# Patient Record
Sex: Female | Born: 1980 | Race: White | Hispanic: No | Marital: Single | Smoking: Never smoker
Health system: Southern US, Community
[De-identification: ages and names within clinical notes are randomized; demographics above are authoritative.]

## PROBLEM LIST (undated history)

## (undated) DIAGNOSIS — F32A Depression, unspecified: Secondary | ICD-10-CM

## (undated) DIAGNOSIS — N809 Endometriosis, unspecified: Secondary | ICD-10-CM

## (undated) DIAGNOSIS — F419 Anxiety disorder, unspecified: Secondary | ICD-10-CM

## (undated) HISTORY — DX: Anxiety disorder, unspecified: F41.9

## (undated) HISTORY — PX: LAPAROSCOPY: SHX197

## (undated) HISTORY — DX: Depression, unspecified: F32.A

---

## 2007-03-25 ENCOUNTER — Other Ambulatory Visit: Admission: RE | Admit: 2007-03-25 | Discharge: 2007-03-25 | Payer: Self-pay | Admitting: Family Medicine

## 2007-09-12 ENCOUNTER — Ambulatory Visit: Admission: RE | Admit: 2007-09-12 | Discharge: 2007-09-12 | Payer: Self-pay | Admitting: Family Medicine

## 2007-10-31 ENCOUNTER — Ambulatory Visit (HOSPITAL_COMMUNITY): Admission: RE | Admit: 2007-10-31 | Discharge: 2007-10-31 | Payer: Self-pay | Admitting: Pulmonary Disease

## 2007-12-05 ENCOUNTER — Ambulatory Visit (HOSPITAL_COMMUNITY): Admission: RE | Admit: 2007-12-05 | Discharge: 2007-12-05 | Payer: Self-pay | Admitting: Internal Medicine

## 2007-12-08 ENCOUNTER — Encounter (INDEPENDENT_AMBULATORY_CARE_PROVIDER_SITE_OTHER): Payer: Self-pay | Admitting: Obstetrics and Gynecology

## 2007-12-08 ENCOUNTER — Ambulatory Visit (HOSPITAL_COMMUNITY): Admission: RE | Admit: 2007-12-08 | Discharge: 2007-12-08 | Payer: Self-pay | Admitting: Obstetrics and Gynecology

## 2007-12-09 ENCOUNTER — Encounter: Admission: RE | Admit: 2007-12-09 | Discharge: 2007-12-09 | Payer: Self-pay | Admitting: Obstetrics and Gynecology

## 2009-03-18 IMAGING — CT CT ABDOMEN W/ CM
2 of 5 series · 17 of 46 positions shown, 19 images · IV contrast (READICAT/WATER & [ID] OMNI 300)
Comparison: Ultrasound from 10/31/2007

CLINICAL DATA: Splenomegalyfound on laparoscopy yesterday

CT ABDOMEN WITH CONTRAST
TECHNIQUE: Multidetector CT imaging of the abdomen was performed
following the standard protocol during bolus administration of
intravenous contrast.
Contrast: 100 ml Omnipaque 300

[Series 3: routine abdomen · axial · 0.70mm/px · z∈[-229,+6]mm · 14 of 55 slices shown, 16 images]
[im 4/55  soft-tissue]
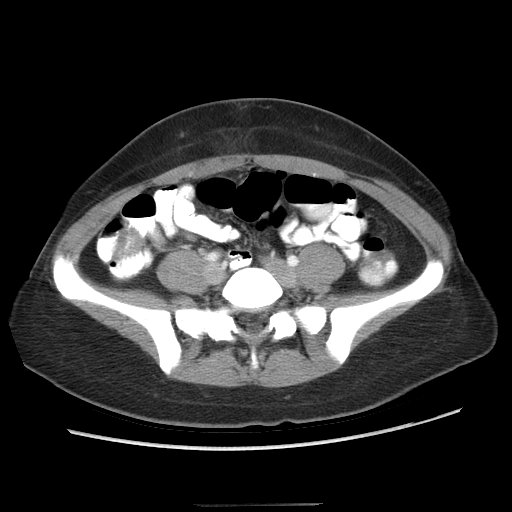
[im 4/55  bone]
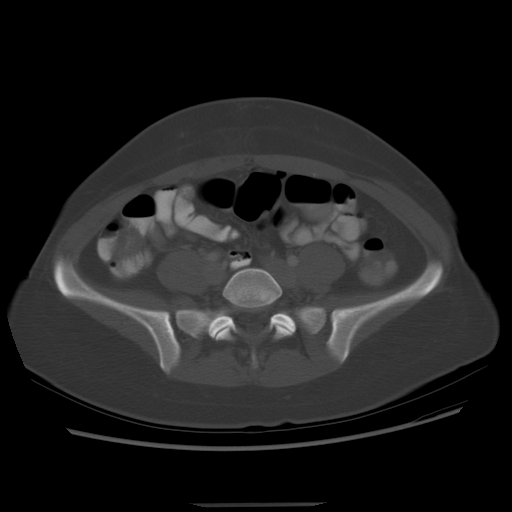
[im 8/55  soft-tissue]
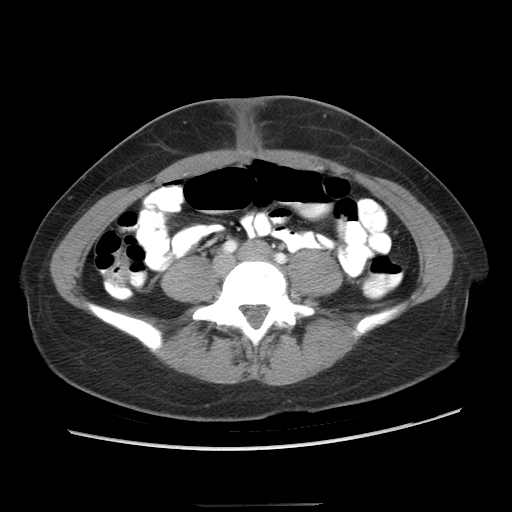
[im 11/55  soft-tissue]
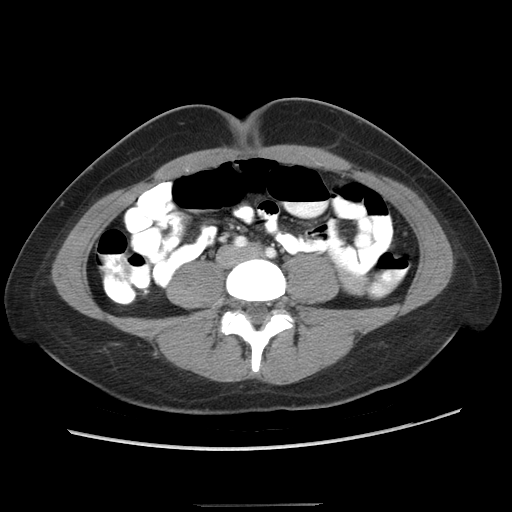
[im 15/55  soft-tissue]
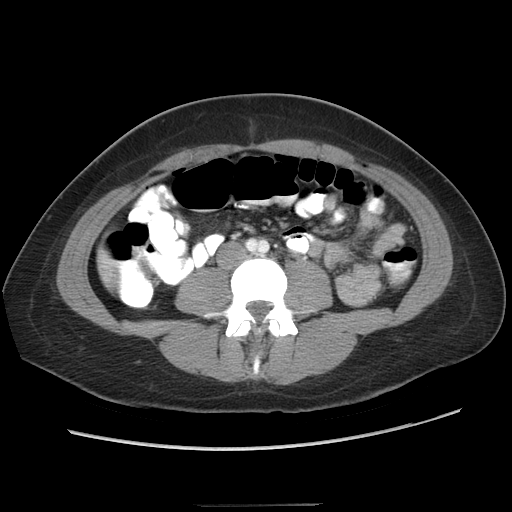
[im 19/55  soft-tissue]
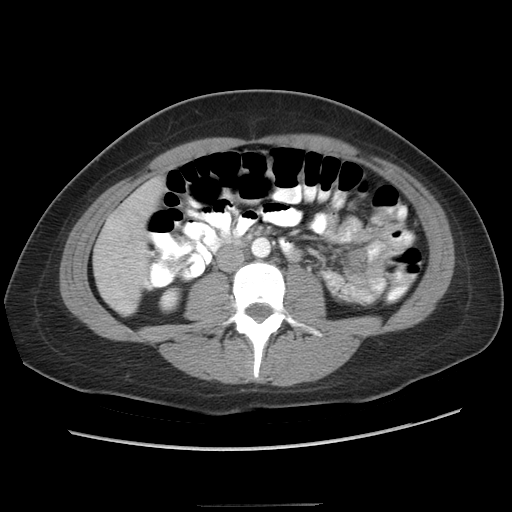
[im 22/55  soft-tissue]
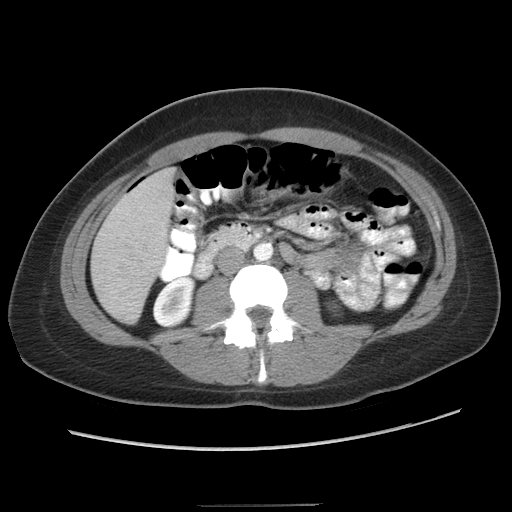
[im 26/55  soft-tissue]
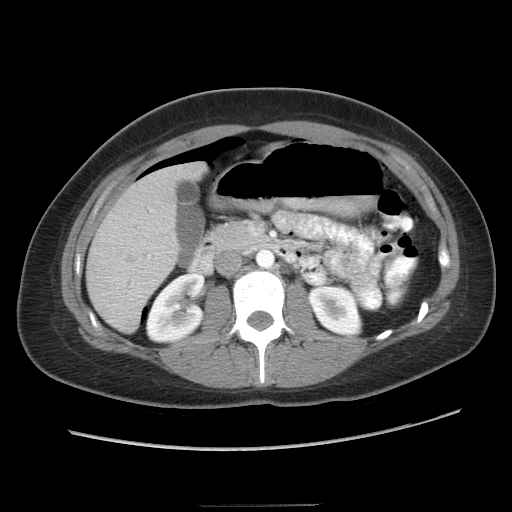
[im 29/55  soft-tissue]
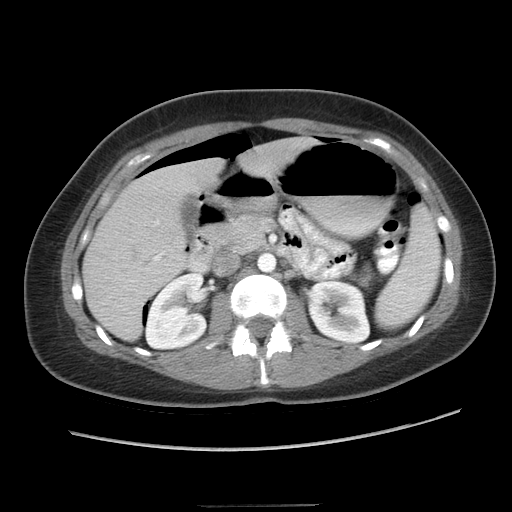
[im 33/55  soft-tissue]
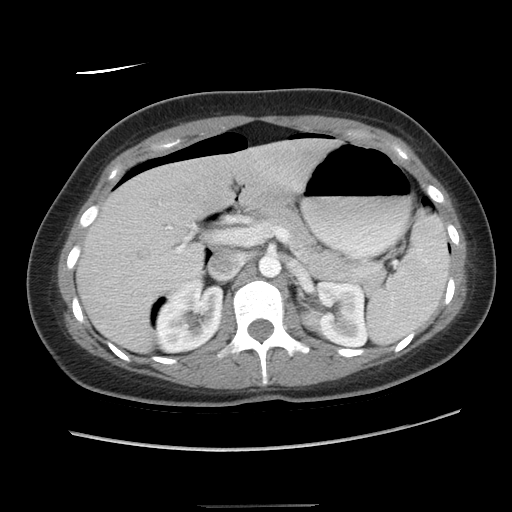
[im 33/55  bone]
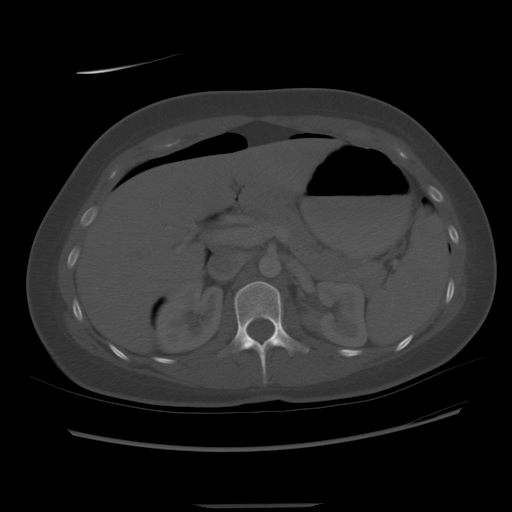
[im 37/55  soft-tissue]
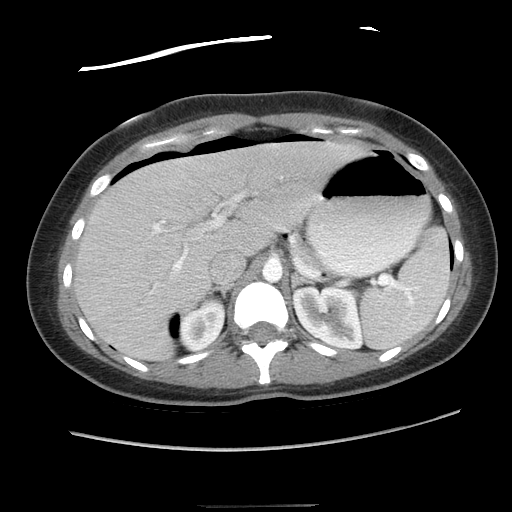
[im 40/55  soft-tissue]
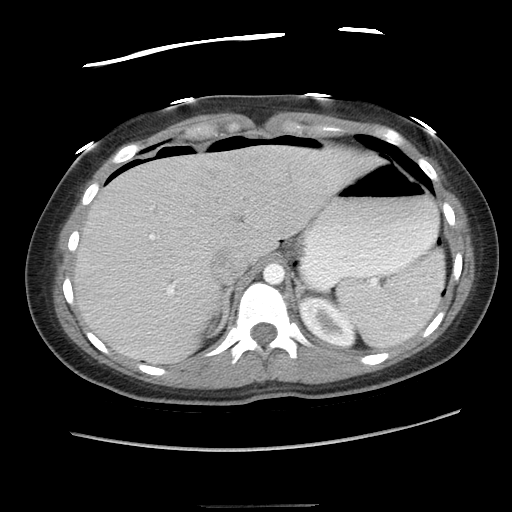
[im 44/55  soft-tissue]
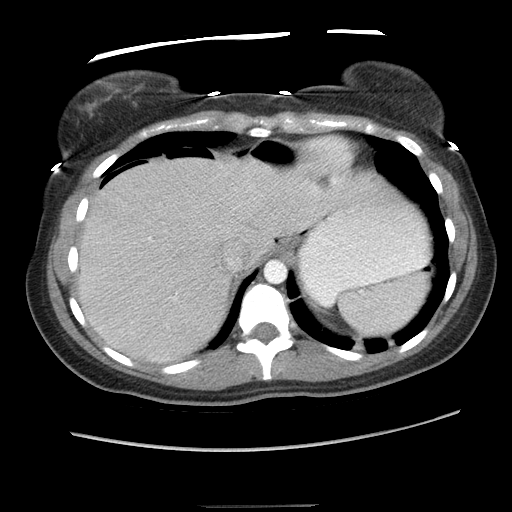
[im 47/55  soft-tissue]
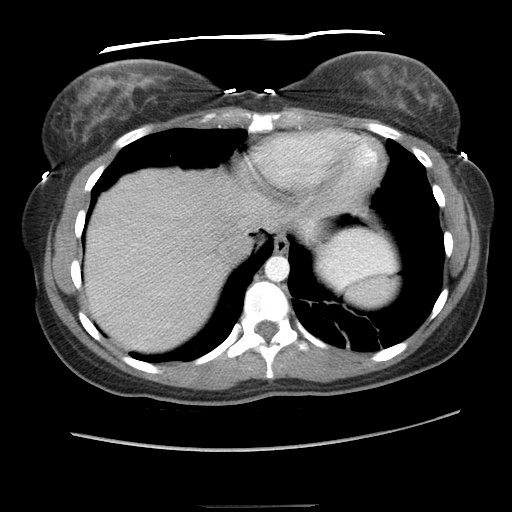
[im 51/55  soft-tissue]
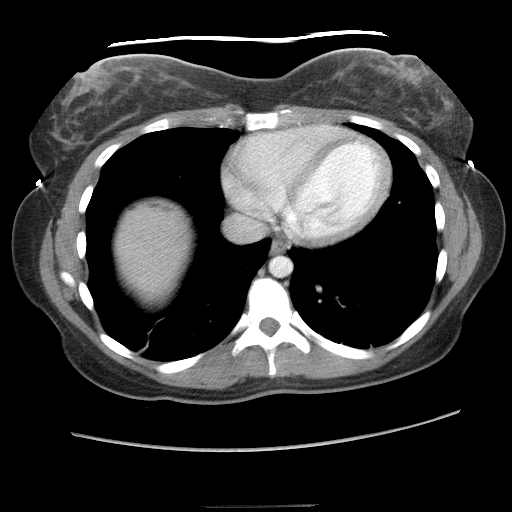

[Series 602: sagittal body · sagittal · 0.70mm/px · 3 of 144 slices shown]
[im 48/144  soft-tissue]
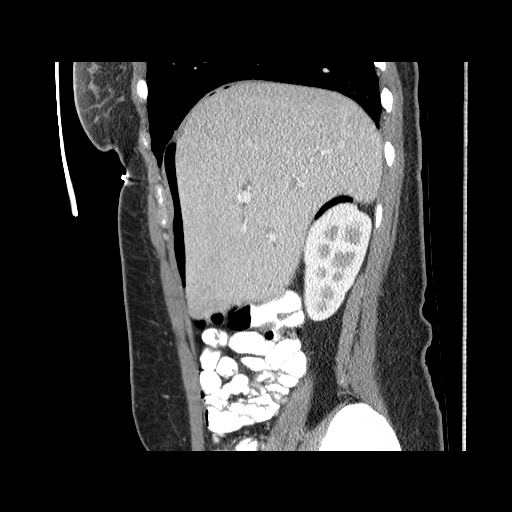
[im 64/144  soft-tissue]
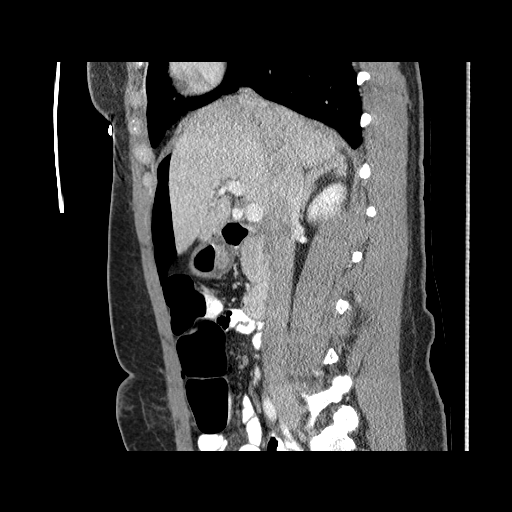
[im 80/144  soft-tissue]
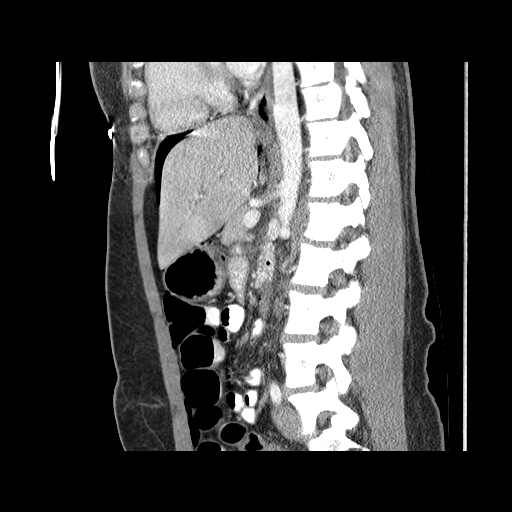

[17 of 46 positions shown; findings below may reference images not displayed]

FINDINGS: Mild periportal edema is seen in the liver.  The spleen
is normal in size.  Cranial caudal length of the spleen is 11 cm
and the coronal diameter is 7.5 cm.  The stomach, duodenum,
pancreas, gallbladder, adrenal glands, and kidneys have normal
imaging features.  There is no free intraperitoneal fluid.

Prominent intra peritoneal gas is compatible with the history of
surgery yesterday.  Edema and gas within the subcutaneous fat at
the level of the umbilicus is compatible with recent trocar
placement.
IMPRESSION: Spleen is normal in size and attenuation.

Sequelae of recent laparoscopy .  The technologist did confirm with
the the patient that her surgery was indeed yesterday.

## 2010-10-01 ENCOUNTER — Encounter: Payer: Self-pay | Admitting: Internal Medicine

## 2011-01-23 NOTE — Op Note (Signed)
Lauren Pena, Lauren Pena                ACCOUNT NO.:  [id number]   MEDICAL RECORD NO.:  [id number]          PATIENT TYPE:  AMB   LOCATION:  SDC                           FACILITY:  [facility]   PHYSICIAN:  Osborn Coho, M.D.   DATE OF BIRTH:  [birth year]   DATE OF PROCEDURE:  12/08/2007  DATE OF DISCHARGE:                               OPERATIVE REPORT   PREOPERATIVE DIAGNOSIS:  Pelvic pain.   POSTOPERATIVE DIAGNOSIS:  Pelvic pain.   PROCEDURE:  1. Diagnostic laparoscopy.  2. Chromopertubation.  3. Peritoneal biopsies.   ATTENDING:  Osborn Coho, M.D.   ASSISTANT:  Jaymes Graff, MD   ANESTHESIA:  General.   SPECIMENS TO PATHOLOGY:  Peritoneal biopsies x5.   FINDINGS:  Bilateral patent fallopian tubes with normal-appearing  bilateral ovaries and fallopian tubes, multiple adhesions of the bowel  to the bilateral side walls.   FLUIDS:  500 mL.   URINE OUTPUT:  200 mL.   ESTIMATED BLOOD LOSS:  Minimal.   COMPLICATIONS:  None.   PROCEDURE:  The patient was taken to the operating room after risks,  benefits, alternatives reviewed with the patient.  The patient  verbalized understanding and consent signed and witnessed.  The patient  was placed under general anesthesia and prepped and draped in the normal  sterile fashion in the dorsal lithotomy position.  A bivalve speculum  was placed in the patient's vagina and the anterior lip of the cervix  grasped with single-tooth tenaculum.  The acorn was placed in the cervix  for intrauterine manipulation and chromopertubation.  The tenaculum was  clamped to the acorn without difficulty.  The Foley was placed in the  bladder to gravity.  Attention was then turned to the abdomen after  gowning and regloving and a 10 mm incision was made at the umbilicus and  Veress needle introduced into the intra-abdominal cavity without  difficulty.  Pneumoperitoneum was achieved.  Multiple adhesions of the  bowel were noted to the bilateral sidewalls.   The spleen appeared  enlarged.  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  After injecting 0.5% Marcaine in the suprapubic  region and the left lower quadrant, 5-mm incisions were made and 5 mm  trocars were advanced under direct visualization.  Biopsies were  performed of the four lesions of the posterior cul-de-sac.  Two were  directly in the midline of the posterior cul-de-sac and two were on the  right lateral aspect of the posterior cul-de-sac.  The endometriotic  implants in the anterior abdominal wall was biopsied as well and was  noted on the right lateral aspect of the anterior abdominal wall near  the anterior cul-de-sac.  Good hemostasis of this area was noted.  There  was lysis of an adhesion from the left ovary to the left side wall.  The  ureters were noted to peristalsis without difficulty.  Irrigation and  suction of the irrigation was performed.  Pneumoperitoneum was relieved.  The patient had been placed in Trendelenburg  for the procedure and was  now removed from Trendelenburg to lay flat.  The fascia was repaired  with 0 Vicryl via a running stitch at the umbilicus and the skin  repaired at the umbilicus with 3-0 Monocryl via subcuticular stitch.  The two 5 mm ports, one in the suprapubic region and one left lower  quadrant were repaired with Dermabond.  Prior to performing the  biopsies, chromopertubation was performed with dilute indigo carmine and  the bilateral fallopian tubes were noted to be patent with free spillage  bilaterally.   Sponge lap and needle count was correct.  The tenaculum and acorn were  removed.  The Foley was removed as well.  The patient tolerated  procedure well and was returned to the recovery room in good condition.      Osborn Coho, M.D.  Electronically Signed     AR/MEDQ  D:  12/08/2007  T:  12/09/2007  Job:   [id number]

## 2011-04-25 ENCOUNTER — Other Ambulatory Visit: Payer: Self-pay

## 2011-06-04 LAB — HCG, SERUM, QUALITATIVE: Preg, Serum: NEGATIVE

## 2014-04-17 ENCOUNTER — Encounter (HOSPITAL_COMMUNITY): Payer: Self-pay | Admitting: Emergency Medicine

## 2014-04-17 DIAGNOSIS — Z8742 Personal history of other diseases of the female genital tract: Secondary | ICD-10-CM | POA: Diagnosis not present

## 2014-04-17 DIAGNOSIS — Z79899 Other long term (current) drug therapy: Secondary | ICD-10-CM | POA: Insufficient documentation

## 2014-04-17 DIAGNOSIS — K2970 Gastritis, unspecified, without bleeding: Secondary | ICD-10-CM | POA: Insufficient documentation

## 2014-04-17 DIAGNOSIS — R1013 Epigastric pain: Secondary | ICD-10-CM | POA: Insufficient documentation

## 2014-04-17 DIAGNOSIS — Z3202 Encounter for pregnancy test, result negative: Secondary | ICD-10-CM | POA: Insufficient documentation

## 2014-04-17 DIAGNOSIS — K2990 Gastroduodenitis, unspecified, without bleeding: Principal | ICD-10-CM

## 2014-04-17 LAB — CBC WITH DIFFERENTIAL/PLATELET
BASOS PCT: 0 % (ref 0–1)
Basophils Absolute: 0 10*3/uL (ref 0.0–0.1)
EOS ABS: 0.2 10*3/uL (ref 0.0–0.7)
Eosinophils Relative: 2 % (ref 0–5)
HCT: 42.4 % (ref 36.0–46.0)
HEMOGLOBIN: 14.7 g/dL (ref 12.0–15.0)
Lymphocytes Relative: 36 % (ref 12–46)
Lymphs Abs: 3.6 10*3/uL (ref 0.7–4.0)
MCH: 30 pg (ref 26.0–34.0)
MCHC: 34.7 g/dL (ref 30.0–36.0)
MCV: 86.5 fL (ref 78.0–100.0)
MONOS PCT: 6 % (ref 3–12)
Monocytes Absolute: 0.6 10*3/uL (ref 0.1–1.0)
NEUTROS PCT: 56 % (ref 43–77)
Neutro Abs: 5.7 10*3/uL (ref 1.7–7.7)
Platelets: 259 10*3/uL (ref 150–400)
RBC: 4.9 MIL/uL (ref 3.87–5.11)
RDW: 12.5 % (ref 11.5–15.5)
WBC: 10 10*3/uL (ref 4.0–10.5)

## 2014-04-17 LAB — URINALYSIS, ROUTINE W REFLEX MICROSCOPIC
Bilirubin Urine: NEGATIVE
Glucose, UA: NEGATIVE mg/dL
Hgb urine dipstick: NEGATIVE
Ketones, ur: NEGATIVE mg/dL
LEUKOCYTES UA: NEGATIVE
NITRITE: NEGATIVE
PROTEIN: NEGATIVE mg/dL
Specific Gravity, Urine: 1.015 (ref 1.005–1.030)
UROBILINOGEN UA: 0.2 mg/dL (ref 0.0–1.0)
pH: 6 (ref 5.0–8.0)

## 2014-04-17 LAB — PREGNANCY, URINE: PREG TEST UR: NEGATIVE

## 2014-04-17 NOTE — ED Notes (Signed)
Pt. reports upper abdominal pain /mid back pain onset this afternoon , denies nausea or vomitting , no fever or chills,  Denies dysuria or vaginal discharge .

## 2014-04-18 ENCOUNTER — Emergency Department (HOSPITAL_COMMUNITY)
Admission: EM | Admit: 2014-04-18 | Discharge: 2014-04-18 | Disposition: A | Discharge status: Home / Self Care | Payer: 59 | Attending: Emergency Medicine | Admitting: Emergency Medicine

## 2014-04-18 DIAGNOSIS — K2970 Gastritis, unspecified, without bleeding: Secondary | ICD-10-CM

## 2014-04-18 HISTORY — DX: Endometriosis, unspecified: N80.9

## 2014-04-18 LAB — COMPREHENSIVE METABOLIC PANEL
ALBUMIN: 3.9 g/dL (ref 3.5–5.2)
ALK PHOS: 80 U/L (ref 39–117)
ALT: 14 U/L (ref 0–35)
ANION GAP: 16 — AB (ref 5–15)
AST: 17 U/L (ref 0–37)
BUN: 17 mg/dL (ref 6–23)
CO2: 25 mEq/L (ref 19–32)
CREATININE: 0.73 mg/dL (ref 0.50–1.10)
Calcium: 9.8 mg/dL (ref 8.4–10.5)
Chloride: 100 mEq/L (ref 96–112)
GFR calc Af Amer: >90 mL/min (ref >90)
GFR calc non Af Amer: >90 mL/min (ref >90)
Glucose, Bld: 102 mg/dL — ABNORMAL HIGH (ref 70–99)
POTASSIUM: 4 meq/L (ref 3.7–5.3)
Sodium: 141 mEq/L (ref 137–147)
TOTAL PROTEIN: 7.4 g/dL (ref 6.0–8.3)
Total Bilirubin: <0.2 mg/dL — ABNORMAL LOW (ref 0.3–1.2)

## 2014-04-18 LAB — LIPASE, BLOOD: LIPASE: 37 U/L (ref 11–59)

## 2014-04-18 MED ORDER — PANTOPRAZOLE SODIUM 20 MG PO TBEC: 40.0000 mg | DELAYED_RELEASE_TABLET | Freq: Every day | ORAL | Status: AC

## 2014-04-18 MED ORDER — PANTOPRAZOLE SODIUM 40 MG PO TBEC
40.0000 mg | DELAYED_RELEASE_TABLET | Freq: Once | ORAL | Status: AC
  Administered 2014-04-18: 40 mg via ORAL
  Filled 2014-04-18: qty 1

## 2014-04-18 MED ORDER — ATENOLOL 25 MG PO TABS: 25.0000 mg | ORAL_TABLET | Freq: Every day | ORAL | Status: AC

## 2014-04-18 MED ORDER — ONDANSETRON 4 MG PO TBDP
4.0000 mg | ORAL_TABLET | Freq: Once | ORAL | Status: AC
  Administered 2014-04-18: 4 mg via ORAL
  Filled 2014-04-18: qty 1

## 2014-04-18 NOTE — ED Provider Notes (Signed)
Medical screening examination/treatment/procedure(s) were conducted as a shared visit with non-physician practitioner(s) and myself.  I personally evaluated the patient during the encounter  Please see my separate respective documentation pertaining to this patient encounter   Vida RollerBrian D Estephania Licciardi, MD 04/18/14 [id number]

## 2014-04-18 NOTE — ED Provider Notes (Signed)
33 year old female, no significant past medical history who presents with a complaint of epigastric discomfort which has some radiation around the sides to the back. She has not been vomiting. This seemed to get worse this evening with laying down and went away with antacid medications. On exam the patient has nontender abdomen, clear heart and lung sounds, normal EKG and normal lab work. Doubt pancreatitis, doubt cholecystitis, likely due to gastritis given the patient's heavy use of anti-inflammatory medications such as Naprosyn for chronic headaches. She is amenable to stopping his medications, I will start alternative headache medications and have followup with family doctor. We'll also start nightly antihistamine such as Pepcid or Zantac.  Medical screening examination/treatment/procedure(s) were conducted as a shared visit with non-physician practitioner(s) and myself.  I personally evaluated the patient during the encounter.  Clinical Impression: Epigastric pain     Vida RollerBrian D Rechelle Niebla, MD 04/18/14 [id number]

## 2014-04-18 NOTE — Discharge Instructions (Signed)
Return to the emergency room with worsening of symptoms or with symptoms that are concerning. Avoid NSAID use (aleve, ibuprofen, motrin, naproxen) Protonix two tablets once daily for 4 weeks. Follow up [facility]CP, [facility]. Take atenolol once daily for HA prevention. Follow up with PCP  Gastritis, Adult Gastritis is soreness and puffiness (inflammation) of the lining of the stomach. If you do not get help, gastritis can cause bleeding and sores (ulcers) in the stomach. HOME CARE   Only take medicine as told by your doctor.  If you were given antibiotic medicines, take them as told. Finish the medicines even if you start to feel better.  Drink enough fluids to keep your pee (urine) clear or pale yellow.  Avoid foods and drinks that make your problems worse. Foods you may want to avoid include:  Caffeine or alcohol.  Chocolate.  Mint.  Garlic and onions.  Spicy foods.  Citrus fruits, including oranges, lemons, or limes.  Food containing tomatoes, including sauce, chili, salsa, and pizza.  Fried and fatty foods.  Eat small meals throughout the day instead of large meals. GET HELP RIGHT AWAY IF:   You have black or dark red poop (stools).  You throw up (vomit) blood. It may look like coffee grounds.  You cannot keep fluids down.  Your belly (abdominal) pain gets worse.  You have a fever.  You do not feel better after 1 week.  You have any other questions or concerns. MAKE SURE YOU:   Understand these instructions.  Will watch your condition.  Will get help right away if you are not doing well or get worse. Document Released: 02/13/2008 Document Revised: 11/19/2011 Document Reviewed: 10/10/2011 [facility] Patient Information 2015 [place]. This information is not intended to replace advice given to you by your health care provider. Make sure you discuss any questions you have with your health care provider.

## 2014-04-18 NOTE — ED Provider Notes (Signed)
CSN: [id number]     Arrival date & time 04/17/14  2317 History   First MD Initiated Contact with Patient 04/18/14 0020     Chief Complaint  Patient presents with  . Abdominal Pain    HPI Patient with PMH of anxiety and endometriosis presents with one day history of epigastric pain. Pain is sharp and does not radiate. Pain waxes and wanes and she is presently asymptomatic. At its worst pain is severe. Pain is associated with eating and not aggravated by breathing or exertion. Pain is similar to other heartburn pain she has had before. Patient endorses NSAID use for chronic HAs. Patient was hypertensive on arrival but has normalized. Patient denies CP, SOB, palpitations, other abdominal pain, no N/V/D or hematochezia. Last BM yesterday and pt reports it was normal.  Patient denies any dizziness or lightheadedness, fever or chills, urinary changes or vaginal discharge. This pain is unlike her pain associated with  endometriosis.    Past Medical History  Diagnosis Date  . Endometriosis    Past Surgical History  Procedure Laterality Date  . Laparoscopy     No family history on file. History  Substance Use Topics  . Smoking status: Never Smoker   . Smokeless tobacco: Not on file  . Alcohol Use: No   OB History   Grav Para Term Preterm Abortions TAB SAB Ect Mult Living                 Review of Systems  Constitutional: Negative for fever and chills.  HENT: Negative for congestion and rhinorrhea.   Eyes: Negative for visual disturbance.  Respiratory: Negative for cough and shortness of breath.   Cardiovascular: Negative for chest pain and palpitations.  Gastrointestinal: Negative for nausea, vomiting and diarrhea.  Genitourinary: Negative for dysuria and hematuria.  Musculoskeletal: Negative for back pain and gait problem.  Skin: Negative for rash.  Neurological: Negative for weakness and headaches.      Allergies  Clindamycin/lincomycin and Codeine  Home Medications   Prior  to Admission medications   Medication Sig Start Date End Date Taking? Authorizing Provider  amphetamine-dextroamphetamine (ADDERALL) 10 MG tablet Take 20-30 mg by mouth daily as needed (focus).   Yes Historical Provider, MD  venlafaxine (EFFEXOR) 75 MG tablet Take 150-225 mg by mouth daily.   Yes Historical Provider, MD  atenolol (TENORMIN) 25 MG tablet Take 1 tablet (25 mg total) by mouth daily. 04/18/14   Louann SjogrenVictoria L Lillyonna Armstead, PA-C  pantoprazole (PROTONIX) 20 MG tablet Take 2 tablets (40 mg total) by mouth daily. 04/18/14   Louann SjogrenVictoria L Roma Bierlein, PA-C   BP 127/86  Pulse 85  Temp(Src) 98.3 F (36.8 C) (Oral)  Resp 17  SpO2 97%  LMP 04/11/2014 Physical Exam  Nursing note and vitals reviewed. Constitutional: She appears well-developed and well-nourished. No distress.  HENT:  Head: Normocephalic and atraumatic.  Eyes: Conjunctivae and EOM are normal. Right eye exhibits no discharge. Left eye exhibits no discharge. No scleral icterus.  Cardiovascular: Normal rate, regular rhythm and normal heart sounds.   Pulmonary/Chest: Effort normal and breath sounds normal. No respiratory distress. She has no wheezes.  Abdominal: Soft. Bowel sounds are normal. She exhibits no distension. There is tenderness. There is no rebound and no guarding.  Tenderness to deep palpation in epigastric region  Musculoskeletal: Normal range of motion. She exhibits no tenderness.  Neurological: She is alert. She exhibits normal muscle tone. Coordination normal.  Skin: Skin is warm and dry. She is not diaphoretic.  Psychiatric: She has a normal mood and affect. Her behavior is normal.    ED Course  Procedures (including critical care time) Labs Review Labs Reviewed  COMPREHENSIVE METABOLIC PANEL - Abnormal; Notable for the following:    Glucose, Bld 102 (*)    Total Bilirubin <0.2 (*)    Anion gap 16 (*)    All other components within normal limits  URINALYSIS, ROUTINE W REFLEX MICROSCOPIC - Abnormal; Notable for the  following:    APPearance HAZY (*)    All other components within normal limits  CBC WITH DIFFERENTIAL  LIPASE, BLOOD  PREGNANCY, URINE    Imaging Review No results found.   EKG Interpretation   Date/Time:  Sunday April 18 2014 00:18:02 EDT Ventricular Rate:  79 PR Interval:  137 QRS Duration: 99 QT Interval:  371 QTC Calculation: 425 R Axis:   69 Text Interpretation:  Sinus rhythm Normal ECG No old tracing to compare  Confirmed by MILLER  MD, BRIAN ([id number]) on 04/18/2014 12:33:46 AM     Meds given in ED:  Medications  pantoprazole (PROTONIX) EC tablet 40 mg (40 mg Oral Given 04/18/14 0112)    New Prescriptions   ATENOLOL (TENORMIN) 25 MG TABLET    Take 1 tablet (25 mg total) by mouth daily.   PANTOPRAZOLE (PROTONIX) 20 MG TABLET    Take 2 tablets (40 mg total) by mouth daily.      MDM   Final diagnoses:  Gastritis  Patient with PMH endometriosis and anxiety presents with history of epigastric pain that is associated with eating and relieved by antiacids. Patient was Hypertensive on arrival but is now stabilized. Patient reported increases in bp with anxiety previously. Patient is afebrile, nontoxic, and in no acute distress. Abdominal laboratory work up unremarkable including WBC, lipase and UA. I suspect gastritis due history and unremarkable abdominal laboratory workup. Patient suitable candidate for outpatient treatment and is stable for discharge. Tx with protonix daily for 4 weeks. Follow up with PCP  History of chronic headaches for which she uses aleve. Prescribed atenolol for HA prophylaxis and stopping all NSAID use. Follow up with PCP for further management.  Return to the emergency room with worsening of symptoms or with symptoms that are concerning.  This is a shared patient. This patient was discussed with the physician who saw and evaluated the patient.    Louann Sjogren, PA-C 04/18/14 [phone]

## 2014-07-05 ENCOUNTER — Other Ambulatory Visit (HOSPITAL_COMMUNITY)
Admission: RE | Admit: 2014-07-05 | Discharge: 2014-07-05 | Disposition: A | Discharge status: Home / Self Care | Payer: 59 | Source: Ambulatory Visit | Attending: Family Medicine | Admitting: Family Medicine

## 2014-07-05 ENCOUNTER — Other Ambulatory Visit: Payer: Self-pay | Admitting: Family Medicine

## 2014-07-05 DIAGNOSIS — Z01419 Encounter for gynecological examination (general) (routine) without abnormal findings: Secondary | ICD-10-CM | POA: Diagnosis present

## 2014-07-05 DIAGNOSIS — Z1151 Encounter for screening for human papillomavirus (HPV): Secondary | ICD-10-CM | POA: Insufficient documentation

## 2014-07-06 LAB — CYTOLOGY - PAP

## 2015-03-10 ENCOUNTER — Other Ambulatory Visit: Payer: Self-pay | Admitting: Family Medicine

## 2015-03-10 DIAGNOSIS — L680 Hirsutism: Secondary | ICD-10-CM

## 2015-03-17 ENCOUNTER — Ambulatory Visit
Admission: RE | Admit: 2015-03-17 | Discharge: 2015-03-17 | Disposition: A | Discharge status: Home / Self Care | Payer: BC Managed Care – PPO | Source: Ambulatory Visit | Attending: Family Medicine | Admitting: Family Medicine

## 2015-03-17 DIAGNOSIS — L680 Hirsutism: Secondary | ICD-10-CM

## 2018-01-10 ENCOUNTER — Ambulatory Visit
Admission: EM | Admit: 2018-01-10 | Discharge: 2018-01-10 | Disposition: A | Discharge status: Home / Self Care | Payer: BLUE CROSS/BLUE SHIELD | Attending: Emergency Medicine | Admitting: Emergency Medicine

## 2018-01-10 ENCOUNTER — Other Ambulatory Visit: Payer: Self-pay

## 2018-01-10 DIAGNOSIS — R300 Dysuria: Secondary | ICD-10-CM | POA: Diagnosis not present

## 2018-01-10 DIAGNOSIS — N3001 Acute cystitis with hematuria: Secondary | ICD-10-CM | POA: Diagnosis not present

## 2018-01-10 LAB — URINALYSIS, COMPLETE (UACMP) WITH MICROSCOPIC
Bacteria, UA: NONE SEEN
Bilirubin Urine: NEGATIVE
Glucose, UA: NEGATIVE mg/dL
Ketones, ur: NEGATIVE mg/dL
Nitrite: NEGATIVE
PROTEIN: NEGATIVE mg/dL
Squamous Epithelial / LPF: NONE SEEN (ref 0–5)
pH: 6 (ref 5.0–8.0)

## 2018-01-10 MED ORDER — NITROFURANTOIN MONOHYD MACRO 100 MG PO CAPS: 100.0000 mg | ORAL_CAPSULE | Freq: Two times a day (BID) | ORAL | 0 refills | Status: AC

## 2018-01-10 MED ORDER — PHENAZOPYRIDINE HCL 200 MG PO TABS: 200.0000 mg | ORAL_TABLET | Freq: Three times a day (TID) | ORAL | 0 refills | Status: AC | PRN

## 2018-01-10 NOTE — ED Provider Notes (Signed)
HPI  SUBJECTIVE:  Lauren Pena is a 37 y.o. female who presents with days of dysuria, urgency, frequency.  She tried increasing fluids and drinking cranberry juice with some improvement in her symptoms.  No aggravating factors.  No cloudy or odorous urine, hematuria.  No fevers, abdominal, back, pelvic pain.  No vaginal bleeding, odor, discharge, rash, itching.  She is in a long-term monogamous relationship with a female who is asymptomatic.  STDs are not a concern today.  No recent antibiotics.  No antipyretic in the past 6 to 8 hours.  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  No history of pyelonephritis, nephrolithiasis.  She has a remote history of gonorrhea, BV, yeast.  No history of chlamydia, HIV, HSV, syphilis, Trichomonas, diabetes, hypertension.  LMP: 2 weeks ago.  Denies the possibility being pregnant.  Has an IUD.  PMD: None.    Past Medical History:  Diagnosis Date  . Endometriosis     Past Surgical History:  Procedure Laterality Date  . LAPAROSCOPY      Family History  Problem Relation Age of Onset  . Cancer Mother        breast  . Atrial fibrillation Father     Social History   Tobacco Use  . Smoking status: Never Smoker  . Smokeless tobacco: Never Used  Substance Use Topics  . Alcohol use: No  . Drug use: No    No current facility-administered medications for this encounter.   Current Outpatient Medications:  .  guanFACINE (INTUNIV) 2 MG TB24 ER tablet, Take 2 mg by mouth., Disp: , Rfl: 6 .  levonorgestrel (MIRENA, 52 MG,) 20 MCG/24HR IUD, Mirena 20 mcg/24 hours (5 yrs) 52 mg intrauterine device  Take 1 insert by intrauterine route., Disp: , Rfl:  .  metFORMIN (GLUCOPHAGE-XR) 500 MG 24 hr tablet, Take 500 mg by mouth., Disp: , Rfl:  .  venlafaxine (EFFEXOR) 75 MG tablet, Take 150-225 mg by mouth daily., Disp: , Rfl:  .  amphetamine-dextroamphetamine (ADDERALL) 10 MG tablet, Take 20-30 mg by  mouth daily as needed (focus)., Disp: , Rfl:  .  atenolol (TENORMIN) 25 MG tablet, Take 1 tablet (25 mg total) by mouth daily., Disp: 30 tablet, Rfl: 0 .  nitrofurantoin, macrocrystal-monohydrate, (MACROBID) 100 MG capsule, Take 1 capsule (100 mg total) by mouth 2 (two) times daily. X 5 days, Disp: 10 capsule, Rfl: 0 .  pantoprazole (PROTONIX) 20 MG tablet, Take 2 tablets (40 mg total) by mouth daily., Disp: 60 tablet, Rfl: 0 .  phenazopyridine (PYRIDIUM) 200 MG tablet, Take 1 tablet (200 mg total) by mouth 3 (three) times daily as needed for pain., Disp: 6 tablet, Rfl: 0  Allergies  Allergen Reactions  . Clindamycin/Lincomycin   . Codeine   . Metronidazole Rash     ROS  As noted in HPI.   Physical Exam  BP 115/89 (BP Location: Left Arm)   Pulse 87   Temp 98.4 F (36.9 C) (Oral)   Ht  (1.6 m)   Wt 165 lb (74.8 kg)   LMP 12/27/2017 (Approximate)   SpO2 100%   BMI 29.23 kg/m   Constitutional: Well developed, well nourished, no acute distress Eyes:  EOMI, conjunctiva normal bilaterally HENT: Normocephalic, atraumatic,mucus membranes moist Respiratory: Normal inspiratory effort Cardiovascular: Normal rate GI: nondistende.d positive suprapubic tenderness.  No flank tenderness. Back: No CVAT skin: No rash, skin intact Musculoskeletal: no deformities Neurologic: Alert &  oriented x 3, no focal neuro deficits Psychiatric: Speech and behavior appropriate   ED Course   Medications - No data to display  Orders Placed This Encounter  Procedures  . Urine culture    Standing Status:   Standing    Number of Occurrences:   1    Order Specific Question:   List patient's active antibiotics    Answer:   macrobid    Order Specific Question:   Patient immune status    Answer:   Normal  . Urinalysis, Complete w Microscopic    Standing Status:   Standing    Number of Occurrences:   1    Results for orders placed or performed during the hospital encounter of 01/10/18 (from  the past 24 hour(s))  Urinalysis, Complete w Microscopic     Status: Abnormal   Collection Time: 01/10/18  5:11 PM  Result Value Ref Range   Color, Urine STRAW (A) YELLOW   APPearance CLEAR CLEAR   Specific Gravity, Urine <1.005 (L) 1.005 - 1.030   pH 6.0 5.0 - 8.0   Glucose, UA NEGATIVE NEGATIVE mg/dL   Hgb urine dipstick MODERATE (A) NEGATIVE   Bilirubin Urine NEGATIVE NEGATIVE   Ketones, ur NEGATIVE NEGATIVE mg/dL   Protein, ur NEGATIVE NEGATIVE mg/dL   Nitrite NEGATIVE NEGATIVE   Leukocytes, UA LARGE (A) NEGATIVE   Squamous Epithelial / LPF NONE SEEN 0 - 5   WBC, UA 21-50 0 - 5 WBC/hpf   RBC / HPF 0-5 0 - 5 RBC/hpf   Bacteria, UA NONE SEEN NONE SEEN   No results found.  ED Clinical Impression  Dysuria  Acute cystitis with hematuria   ED Assessment/Plan  No previous urine culture results available.  Urine has hematuria and leukocytes.  No bacteria noted on micro exam, however her presentation is most suggestive of a UTI, so we will send her urine off for culture to confirm the presence or absence of UTI and to confirm antibiotic choice.  Home with Macrobid, Pyridium.  Doubt GYN infection although this is in the differential.  Providing primary care referral for recheck of her urine in a week to make sure that the hematuria has resolved.  Discussed labs, MDM, treatment plan, and plan for follow-up with patient. Discussed sn/sx that should prompt return to the ED. patient agrees with plan.   Meds ordered this encounter  Medications  . phenazopyridine (PYRIDIUM) 200 MG tablet    Sig: Take 1 tablet (200 mg total) by mouth 3 (three) times daily as needed for pain.    Dispense:  6 tablet    Refill:  0  . nitrofurantoin, macrocrystal-monohydrate, (MACROBID) 100 MG capsule    Sig: Take 1 capsule (100 mg total) by mouth 2 (two) times daily. X 5 days    Dispense:  10 capsule    Refill:  0    *This clinic note was created using Scientist, clinical (histocompatibility and immunogenetics). Therefore, there may  be occasional mistakes despite careful proofreading.   ?   Domenick Gong, MD 01/10/18 2036

## 2018-01-10 NOTE — Discharge Instructions (Addendum)
Here is a list of primary care providers who are taking new patients: ° °Dr. Deanna Jones, Dr. William Plonk °[address] °[address] °[place] [address] °[phone] ° °[facility] [place] °[address] [place] [address]  °[place] [address]  °[phone] ° °[facility] °[address]  °[place], [place] [address] °[phone] ° °[facility] [place] °[address]  °[phone] °[place], [place] [address] ° °Here are clinics/ other resources who will see you if you do not have insurance. Some have certain criteria that you must meet. Call them and find out what they are: ° °[facility]: °[address] [place] [address] [place], [place] [address] °Phone: [phone] °Hours: First and Third Saturdays of each Month, 9 a.m. - 1 p.m. ° °[facility]: °[address], [place], [place] [address] °Phone: [phone] °Hours: °Tuesday, 4 p.m. - 8 p.m. °Thursday, 1 p.m. - 8 p.m. °Wednesday, 9 a.m. - Noon ° °[facility] °[address], [place], [place] [address] °Phone: [phone] °Pharmacy Phone Number: [phone] °Dental Phone Number: [phone] °[employer] Insurance Help: [phone] ° °Dental Hours: °Monday - Thursday, 8 a.m. - 6 p.m. ° °[facility] °[address] [place], [place] [address] °Phone: [phone] °Pharmacy Phone Number: [phone] °[employer] Insurance Help: [phone] ° °[facility] °[address] [place], [place] [address] °Phone: [phone] °Pharmacy Phone Number: [phone] °[employer] Insurance Help: [phone] ° °[facility] °[address] [place], [place] [address] °Phone: [phone] °[employer] Insurance Help: [phone]  ° °[facility] °[address] [place],  [address] °Phone: [phone] ° °Go to www.goodrx.com to look up your medications. This will give you a list of where you can find your prescriptions at the most affordable prices. Or ask the pharmacist what the cash price is,  or if they have any other discount programs available to help make your medication more affordable. This can be less expensive than what you would pay with insurance.   °

## 2018-01-10 NOTE — ED Triage Notes (Signed)
Patient has been experiencing pain with urination x 5 days. She states she has a history of urinary infections.

## 2018-01-13 LAB — URINE CULTURE
Culture: >=100000 — AB
SPECIAL REQUESTS: NORMAL

## 2023-07-10 ENCOUNTER — Ambulatory Visit: Payer: BC Managed Care – PPO | Attending: Obstetrics and Gynecology

## 2023-07-10 ENCOUNTER — Other Ambulatory Visit: Payer: Self-pay

## 2023-07-10 DIAGNOSIS — N3946 Mixed incontinence: Secondary | ICD-10-CM | POA: Insufficient documentation

## 2023-07-10 DIAGNOSIS — M6281 Muscle weakness (generalized): Secondary | ICD-10-CM | POA: Diagnosis present

## 2023-07-10 DIAGNOSIS — R2689 Other abnormalities of gait and mobility: Secondary | ICD-10-CM | POA: Diagnosis present

## 2023-07-10 DIAGNOSIS — R293 Abnormal posture: Secondary | ICD-10-CM | POA: Insufficient documentation

## 2023-07-10 NOTE — Patient Instructions (Signed)
TOILET POSTURE: Urination: feet flat, lean forward with forearms on legs to fully empty bladder. Bowel movement: place feet flat on Squat[place] Potty or stool so knees are higher than hips, lean forward to relax pelvic floor in order to avoid strain.  SHOES: wear supportive shoes, and sandals with straps.  POSTURE: try not to cross legs at knees or ankles. Try the figure four stretch instead.  WATER: start with water first thing in the morning.
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
## 2023-07-17 ENCOUNTER — Ambulatory Visit: Payer: BC Managed Care – PPO | Attending: Obstetrics and Gynecology

## 2023-07-17 ENCOUNTER — Other Ambulatory Visit: Payer: Self-pay

## 2023-07-17 DIAGNOSIS — M6281 Muscle weakness (generalized): Secondary | ICD-10-CM | POA: Insufficient documentation

## 2023-07-17 DIAGNOSIS — R2689 Other abnormalities of gait and mobility: Secondary | ICD-10-CM | POA: Insufficient documentation

## 2023-07-17 DIAGNOSIS — N3946 Mixed incontinence: Secondary | ICD-10-CM | POA: Diagnosis present

## 2023-07-17 DIAGNOSIS — R293 Abnormal posture: Secondary | ICD-10-CM | POA: Insufficient documentation
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
## 2023-07-30 ENCOUNTER — Ambulatory Visit: Payer: BC Managed Care – PPO

## 2023-07-30 DIAGNOSIS — R2689 Other abnormalities of gait and mobility: Secondary | ICD-10-CM

## 2023-07-30 DIAGNOSIS — M6281 Muscle weakness (generalized): Secondary | ICD-10-CM

## 2023-07-30 DIAGNOSIS — R293 Abnormal posture: Secondary | ICD-10-CM

## 2023-07-30 DIAGNOSIS — N3946 Mixed incontinence: Secondary | ICD-10-CM
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
## 2023-08-13 ENCOUNTER — Ambulatory Visit: Payer: BC Managed Care – PPO | Attending: Obstetrics and Gynecology

## 2023-08-13 ENCOUNTER — Other Ambulatory Visit: Payer: Self-pay

## 2023-08-13 DIAGNOSIS — N3946 Mixed incontinence: Secondary | ICD-10-CM | POA: Diagnosis present

## 2023-08-13 DIAGNOSIS — R2689 Other abnormalities of gait and mobility: Secondary | ICD-10-CM | POA: Insufficient documentation

## 2023-08-13 DIAGNOSIS — R293 Abnormal posture: Secondary | ICD-10-CM | POA: Insufficient documentation

## 2023-08-13 DIAGNOSIS — M6281 Muscle weakness (generalized): Secondary | ICD-10-CM | POA: Diagnosis present
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
## 2023-08-20 ENCOUNTER — Ambulatory Visit: Payer: BC Managed Care – PPO

## 2023-08-20 ENCOUNTER — Other Ambulatory Visit: Payer: Self-pay

## 2023-08-20 DIAGNOSIS — N3946 Mixed incontinence: Secondary | ICD-10-CM | POA: Diagnosis not present

## 2023-08-20 DIAGNOSIS — M6281 Muscle weakness (generalized): Secondary | ICD-10-CM

## 2023-08-20 DIAGNOSIS — R293 Abnormal posture: Secondary | ICD-10-CM

## 2023-08-20 DIAGNOSIS — R2689 Other abnormalities of gait and mobility: Secondary | ICD-10-CM
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
## 2023-08-27 ENCOUNTER — Other Ambulatory Visit: Payer: Self-pay

## 2023-08-27 ENCOUNTER — Ambulatory Visit: Payer: BC Managed Care – PPO

## 2023-08-27 DIAGNOSIS — N3946 Mixed incontinence: Secondary | ICD-10-CM

## 2023-08-27 DIAGNOSIS — R2689 Other abnormalities of gait and mobility: Secondary | ICD-10-CM

## 2023-08-27 DIAGNOSIS — M6281 Muscle weakness (generalized): Secondary | ICD-10-CM

## 2023-08-27 DIAGNOSIS — R293 Abnormal posture: Secondary | ICD-10-CM
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
## 2023-09-09 ENCOUNTER — Other Ambulatory Visit: Payer: Self-pay

## 2023-09-09 ENCOUNTER — Ambulatory Visit: Payer: BC Managed Care – PPO

## 2023-09-09 DIAGNOSIS — R2689 Other abnormalities of gait and mobility: Secondary | ICD-10-CM

## 2023-09-09 DIAGNOSIS — N3946 Mixed incontinence: Secondary | ICD-10-CM

## 2023-09-09 DIAGNOSIS — M6281 Muscle weakness (generalized): Secondary | ICD-10-CM

## 2023-09-09 DIAGNOSIS — R293 Abnormal posture: Secondary | ICD-10-CM
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
## 2023-09-17 ENCOUNTER — Ambulatory Visit: Payer: BC Managed Care – PPO

## 2023-09-24 ENCOUNTER — Ambulatory Visit: Payer: BC Managed Care – PPO

## 2023-12-17 ENCOUNTER — Ambulatory Visit: Attending: Obstetrics and Gynecology

## 2023-12-17 ENCOUNTER — Other Ambulatory Visit: Payer: Self-pay

## 2023-12-17 DIAGNOSIS — M6281 Muscle weakness (generalized): Secondary | ICD-10-CM | POA: Diagnosis present

## 2023-12-17 DIAGNOSIS — N3946 Mixed incontinence: Secondary | ICD-10-CM | POA: Diagnosis present

## 2023-12-17 DIAGNOSIS — R293 Abnormal posture: Secondary | ICD-10-CM | POA: Insufficient documentation

## 2023-12-17 DIAGNOSIS — R2689 Other abnormalities of gait and mobility: Secondary | ICD-10-CM | POA: Diagnosis present
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
## 2023-12-24 ENCOUNTER — Ambulatory Visit

## 2023-12-24 ENCOUNTER — Other Ambulatory Visit: Payer: Self-pay

## 2023-12-24 DIAGNOSIS — R293 Abnormal posture: Secondary | ICD-10-CM

## 2023-12-24 DIAGNOSIS — N3946 Mixed incontinence: Secondary | ICD-10-CM

## 2023-12-24 DIAGNOSIS — M6281 Muscle weakness (generalized): Secondary | ICD-10-CM

## 2023-12-24 DIAGNOSIS — R2689 Other abnormalities of gait and mobility: Secondary | ICD-10-CM
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
## 2023-12-31 ENCOUNTER — Other Ambulatory Visit: Payer: Self-pay

## 2023-12-31 ENCOUNTER — Ambulatory Visit

## 2023-12-31 DIAGNOSIS — N3946 Mixed incontinence: Secondary | ICD-10-CM | POA: Diagnosis not present

## 2023-12-31 DIAGNOSIS — M6281 Muscle weakness (generalized): Secondary | ICD-10-CM

## 2023-12-31 DIAGNOSIS — R2689 Other abnormalities of gait and mobility: Secondary | ICD-10-CM

## 2023-12-31 DIAGNOSIS — R293 Abnormal posture: Secondary | ICD-10-CM
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
## 2023-12-31 NOTE — Patient Instructions (Signed)
 Overhead thoracic extension with weight: lying down with knees bent, feet flat Hold 10lbs. Weight at mid chest and lift overhead 2-3x10 reps, three days a week.   Bridges on ball: 2-3 sets of 10, engaging deep core and glutes.

## 2024-01-07 ENCOUNTER — Ambulatory Visit

## 2024-01-07 ENCOUNTER — Other Ambulatory Visit: Payer: Self-pay

## 2024-01-07 DIAGNOSIS — N3946 Mixed incontinence: Secondary | ICD-10-CM | POA: Diagnosis not present

## 2024-01-07 DIAGNOSIS — M6281 Muscle weakness (generalized): Secondary | ICD-10-CM

## 2024-01-07 DIAGNOSIS — R2689 Other abnormalities of gait and mobility: Secondary | ICD-10-CM

## 2024-01-07 DIAGNOSIS — R293 Abnormal posture: Secondary | ICD-10-CM
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
## 2024-01-14 ENCOUNTER — Ambulatory Visit: Attending: Obstetrics and Gynecology

## 2024-01-14 ENCOUNTER — Other Ambulatory Visit: Payer: Self-pay

## 2024-01-14 DIAGNOSIS — R2689 Other abnormalities of gait and mobility: Secondary | ICD-10-CM

## 2024-01-14 DIAGNOSIS — M6281 Muscle weakness (generalized): Secondary | ICD-10-CM | POA: Diagnosis present

## 2024-01-14 DIAGNOSIS — N3946 Mixed incontinence: Secondary | ICD-10-CM | POA: Diagnosis present

## 2024-01-14 DIAGNOSIS — R293 Abnormal posture: Secondary | ICD-10-CM | POA: Diagnosis present
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
## 2024-01-21 ENCOUNTER — Telehealth: Payer: Self-pay

## 2024-01-21 NOTE — Telephone Encounter (Signed)
 LMV seeing if patient would like an afternoon appointment today and next week with Ramonia Burns. Left our phone number and days available.Aaron Aas

## 2024-02-04 ENCOUNTER — Ambulatory Visit

## 2024-02-04 ENCOUNTER — Other Ambulatory Visit: Payer: Self-pay

## 2024-02-04 DIAGNOSIS — R2689 Other abnormalities of gait and mobility: Secondary | ICD-10-CM

## 2024-02-04 DIAGNOSIS — R293 Abnormal posture: Secondary | ICD-10-CM

## 2024-02-04 DIAGNOSIS — M6281 Muscle weakness (generalized): Secondary | ICD-10-CM

## 2024-02-04 DIAGNOSIS — N3946 Mixed incontinence: Secondary | ICD-10-CM | POA: Diagnosis not present
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
## 2024-02-04 NOTE — Patient Instructions (Signed)
 As needed: - Supine Angels  - 1 x daily - 7 x weekly - 1 sets - 10 reps - Sidelying IT Band Foam Roll Mobilization  - 1 x daily - 7 x weekly - 1 sets - 2-3 reps - Standing Pelvic Tilt  - 1 x daily - 7 x weekly - 1 sets - 10 reps (standing at work-put one foot up at a time and then switch)  - Supine Diaphragmatic Breathing  - 1 x daily - 7 x weekly - 1 sets - 5 reps (daily-lying down, seated, standing) - Cat Cow  - 1 x daily - 7 x weekly - 1 sets - 5 reps  Three days/week: arms one day, legs another day, all one day - Clam with Resistance  - 1 x daily - 3 x weekly - 3 sets - 10 reps - 1-2 hold - Dead Bug  - 1 x daily - 3 x weekly - 3 sets - 10 reps - Bird Dog  - 1 x daily - 3 x weekly - 1 sets - 10 reps - Bear Plank from [employer]  - 1 x daily - 3 x weekly - 1 sets - 3-5 reps - 15-30 hold - Prone Gluteal Sets  - 1 x daily - 7 x weekly - 1 sets - 10 reps - Side Plank on Knees  - 1 x daily - 3 x weekly - 1 sets - 3-5 reps - 10-15 hold - Bent Over Single Arm Shoulder Row with Dumbbell  - 1 x daily - 3 x weekly - 3 sets - 10 reps - Half Kneeling Anti-Rotation Press With Shoulder Flexion and Anchored Resistance  - 1 x daily - 3 x weekly - 3 sets - 10 reps - Single Arm Low Trap Setting at Wall  - 1 x daily - 3 x weekly - 3 sets - 10 reps -Diagonal 1 -Diagonal 2 **You can also sub barre or pilates on one day.  Five days a week: walking-goal up to 30 minutes. You can also cycle or swim.

## 2024-02-18 ENCOUNTER — Other Ambulatory Visit: Payer: Self-pay

## 2024-02-18 ENCOUNTER — Ambulatory Visit: Attending: Obstetrics and Gynecology

## 2024-02-18 DIAGNOSIS — N3946 Mixed incontinence: Secondary | ICD-10-CM | POA: Insufficient documentation

## 2024-02-18 DIAGNOSIS — M6281 Muscle weakness (generalized): Secondary | ICD-10-CM | POA: Diagnosis present

## 2024-02-18 DIAGNOSIS — R293 Abnormal posture: Secondary | ICD-10-CM | POA: Diagnosis present

## 2024-02-18 DIAGNOSIS — R2689 Other abnormalities of gait and mobility: Secondary | ICD-10-CM | POA: Diagnosis present

## 2024-02-18 NOTE — Patient Instructions (Signed)
 Peloton: barre and pilates for stability and endurance. Strength training with weights (progressive overload), last 1-2 reps should be challenging. Increase weights each week as tolerated.   Cardio: Increasing walking, swimming, or biking by 5 minutes each week.
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
## 2024-02-25 ENCOUNTER — Ambulatory Visit

## 2024-03-05 ENCOUNTER — Encounter

## 2024-03-12 ENCOUNTER — Encounter

## 2024-03-19 ENCOUNTER — Encounter

## 2024-04-02 ENCOUNTER — Encounter

## 2024-04-09 ENCOUNTER — Encounter

## 2024-04-16 ENCOUNTER — Encounter

## 2024-04-23 ENCOUNTER — Encounter

## 2024-04-30 ENCOUNTER — Encounter

## 2024-05-07 ENCOUNTER — Encounter

## 2024-05-14 ENCOUNTER — Encounter
